# Patient Record
Sex: Female | Born: 1990 | Race: Black or African American | Hispanic: No | Marital: Single | Smoking: Never smoker
Health system: Southern US, Community
[De-identification: ages and names within clinical notes are randomized; demographics above are authoritative.]

## PROBLEM LIST (undated history)

## (undated) HISTORY — PX: ACHILLES TENDON REPAIR: SUR1153

---
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
## 2009-05-01 ENCOUNTER — Emergency Department (HOSPITAL_COMMUNITY): Admission: EM | Admit: 2009-05-01 | Discharge: 2009-05-01 | Payer: Self-pay | Admitting: Family Medicine

## 2009-05-09 ENCOUNTER — Other Ambulatory Visit: Admission: RE | Admit: 2009-05-09 | Discharge: 2009-05-09 | Payer: Self-pay | Admitting: Family Medicine

## 2010-05-08 LAB — POCT PREGNANCY, URINE: Preg Test, Ur: NEGATIVE

## 2011-03-14 ENCOUNTER — Ambulatory Visit (INDEPENDENT_AMBULATORY_CARE_PROVIDER_SITE_OTHER): Payer: Managed Care, Other (non HMO) | Admitting: Internal Medicine

## 2011-03-14 VITALS — BP 104/72 | HR 88 | Temp 98.4°F | Resp 16 | Ht 62.75 in | Wt 159.0 lb

## 2011-03-14 DIAGNOSIS — J029 Acute pharyngitis, unspecified: Secondary | ICD-10-CM

## 2011-03-14 MED ORDER — MELOXICAM 7.5 MG PO TABS: 7.5000 mg | ORAL_TABLET | Freq: Every day | ORAL | Status: AC

## 2011-03-14 MED ORDER — HYDROCODONE-HOMATROPINE 5-1.5 MG/5ML PO SYRP: 5.0000 mL | ORAL_SOLUTION | Freq: Three times a day (TID) | ORAL | Status: AC | PRN

## 2011-03-14 NOTE — Patient Instructions (Signed)
See A&P 

## 2011-03-14 NOTE — Progress Notes (Signed)
  Subjective:    Patient ID: Whitney Hall, female    DOB: 11-27-1990, 21 y.o.   MRN: [id number]  Cough This is a new problem. The current episode started yesterday. The problem has been unchanged. The problem occurs constantly. The cough is non-productive. Associated symptoms include headaches and a sore throat. Pertinent negatives include no ear pain or rhinorrhea. The symptoms are aggravated by cold air.  Sore Throat  Associated symptoms include coughing and headaches. Pertinent negatives include no ear pain or neck pain.  Headache  Associated symptoms include coughing and a sore throat. Pertinent negatives include no ear pain, neck pain or rhinorrhea.      Review of Systems  Constitutional: Negative.   HENT: Positive for sore throat. Negative for ear pain, rhinorrhea and neck pain.   Eyes: Negative.   Respiratory: Positive for cough.   Cardiovascular: Negative.   Gastrointestinal: Negative.   Genitourinary: Negative.   Musculoskeletal: Negative.   Neurological: Positive for headaches.       Objective:   Physical Exam  Constitutional: She is oriented to person, place, and time. She appears well-developed and well-nourished.  HENT:  Head: Normocephalic.  Eyes: Conjunctivae are normal. Pupils are equal, round, and reactive to light.  Neck: Neck supple.  Cardiovascular: Normal rate, regular rhythm and normal heart sounds.   Pulmonary/Chest: Effort normal and breath sounds normal. No respiratory distress. She has no wheezes. She has no rales. She exhibits no tenderness.  Abdominal: Soft.  Neurological: She is alert and oriented to person, place, and time.  Skin: Skin is warm and dry.          Assessment & Plan:  [name]'s symptoms are consistent with a viral illness and she is prescribed medications to relieve her symptoms.  She is advised to rest and hydrate well and RTC if not improved in 2-3 days.

## 2013-10-08 ENCOUNTER — Other Ambulatory Visit: Payer: Self-pay | Admitting: Otolaryngology

## 2013-10-08 DIAGNOSIS — E010 Iodine-deficiency related diffuse (endemic) goiter: Secondary | ICD-10-CM

## 2013-10-16 ENCOUNTER — Ambulatory Visit
Admission: RE | Admit: 2013-10-16 | Discharge: 2013-10-16 | Disposition: A | Discharge status: Home / Self Care | Payer: Managed Care, Other (non HMO) | Source: Ambulatory Visit | Attending: Otolaryngology | Admitting: Otolaryngology

## 2013-10-16 DIAGNOSIS — E010 Iodine-deficiency related diffuse (endemic) goiter: Secondary | ICD-10-CM

## 2013-10-16 MED ORDER — IOHEXOL 300 MG/ML  SOLN
75.0000 mL | Freq: Once | INTRAMUSCULAR | Status: AC | PRN
  Administered 2013-10-16: 75 mL via INTRAVENOUS

## 2014-10-26 ENCOUNTER — Other Ambulatory Visit: Payer: Self-pay | Admitting: Otolaryngology

## 2014-10-26 DIAGNOSIS — E010 Iodine-deficiency related diffuse (endemic) goiter: Secondary | ICD-10-CM

## 2015-05-08 ENCOUNTER — Emergency Department (HOSPITAL_BASED_OUTPATIENT_CLINIC_OR_DEPARTMENT_OTHER)
Admission: EM | Admit: 2015-05-08 | Discharge: 2015-05-08 | Disposition: A | Discharge status: Home / Self Care | Payer: No Typology Code available for payment source | Attending: Emergency Medicine | Admitting: Emergency Medicine

## 2015-05-08 ENCOUNTER — Encounter (HOSPITAL_BASED_OUTPATIENT_CLINIC_OR_DEPARTMENT_OTHER): Payer: Self-pay | Admitting: Emergency Medicine

## 2015-05-08 DIAGNOSIS — Y92410 Unspecified street and highway as the place of occurrence of the external cause: Secondary | ICD-10-CM | POA: Insufficient documentation

## 2015-05-08 DIAGNOSIS — R519 Headache, unspecified: Secondary | ICD-10-CM

## 2015-05-08 DIAGNOSIS — Y998 Other external cause status: Secondary | ICD-10-CM | POA: Insufficient documentation

## 2015-05-08 DIAGNOSIS — Z79899 Other long term (current) drug therapy: Secondary | ICD-10-CM | POA: Insufficient documentation

## 2015-05-08 DIAGNOSIS — S0990XA Unspecified injury of head, initial encounter: Secondary | ICD-10-CM | POA: Insufficient documentation

## 2015-05-08 DIAGNOSIS — M542 Cervicalgia: Secondary | ICD-10-CM

## 2015-05-08 DIAGNOSIS — S199XXA Unspecified injury of neck, initial encounter: Secondary | ICD-10-CM | POA: Insufficient documentation

## 2015-05-08 DIAGNOSIS — Y9389 Activity, other specified: Secondary | ICD-10-CM | POA: Insufficient documentation

## 2015-05-08 DIAGNOSIS — R51 Headache: Secondary | ICD-10-CM
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
## 2015-05-08 NOTE — ED Notes (Signed)
Pt reports history of migraines, states her migraines are not daily, pt has had pain to back of head that is radiating to top of head since thur, no relief
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
## 2015-07-07 ENCOUNTER — Other Ambulatory Visit: Payer: Self-pay | Admitting: Family Medicine

## 2015-07-07 DIAGNOSIS — R5381 Other malaise: Secondary | ICD-10-CM

## 2015-07-13 ENCOUNTER — Other Ambulatory Visit: Payer: Self-pay | Admitting: Family Medicine

## 2015-07-13 DIAGNOSIS — N644 Mastodynia: Secondary | ICD-10-CM

## 2015-07-13 DIAGNOSIS — N6011 Diffuse cystic mastopathy of right breast: Secondary | ICD-10-CM

## 2015-07-19 ENCOUNTER — Other Ambulatory Visit: Payer: Managed Care, Other (non HMO)

## 2015-07-26 ENCOUNTER — Ambulatory Visit
Admission: RE | Admit: 2015-07-26 | Discharge: 2015-07-26 | Disposition: A | Discharge status: Home / Self Care | Payer: BLUE CROSS/BLUE SHIELD | Source: Ambulatory Visit | Attending: Family Medicine | Admitting: Family Medicine

## 2015-07-26 DIAGNOSIS — N644 Mastodynia: Secondary | ICD-10-CM

## 2015-07-26 DIAGNOSIS — N6011 Diffuse cystic mastopathy of right breast: Secondary | ICD-10-CM

## 2016-12-29 ENCOUNTER — Emergency Department (HOSPITAL_COMMUNITY)
Admission: EM | Admit: 2016-12-29 | Discharge: 2016-12-30 | Disposition: A | Discharge status: Home / Self Care | Payer: Self-pay | Attending: Emergency Medicine | Admitting: Emergency Medicine

## 2016-12-29 ENCOUNTER — Encounter (HOSPITAL_COMMUNITY): Payer: Self-pay | Admitting: Emergency Medicine

## 2016-12-29 ENCOUNTER — Other Ambulatory Visit: Payer: Self-pay

## 2016-12-29 ENCOUNTER — Emergency Department (HOSPITAL_COMMUNITY): Payer: Self-pay

## 2016-12-29 DIAGNOSIS — Z79899 Other long term (current) drug therapy: Secondary | ICD-10-CM | POA: Insufficient documentation

## 2016-12-29 DIAGNOSIS — R05 Cough: Secondary | ICD-10-CM

## 2016-12-29 DIAGNOSIS — R059 Cough, unspecified: Secondary | ICD-10-CM

## 2016-12-29 DIAGNOSIS — B349 Viral infection, unspecified: Secondary | ICD-10-CM | POA: Insufficient documentation

## 2016-12-29 MED ORDER — NAPROXEN 500 MG PO TABS
500.0000 mg | ORAL_TABLET | Freq: Once | ORAL | Status: AC
  Administered 2016-12-29: 500 mg via ORAL
  Filled 2016-12-29: qty 1

## 2016-12-29 NOTE — ED Triage Notes (Signed)
Patient is complaining of cold chills, coughing, sore throat, generalized body aches, nausea, and headache. Patient states this started yesterday.

## 2016-12-30 MED ORDER — NAPROXEN 500 MG PO TABS: 500.0000 mg | ORAL_TABLET | Freq: Two times a day (BID) | ORAL | 0 refills | Status: DC | PRN

## 2016-12-30 NOTE — Discharge Instructions (Signed)
It was my pleasure taking care of you today!  Naproxen as needed for body aches / fevers. You can alternate with Tylenol as needed. Flonase and mucinex for nasal congestion. Rest, drink plenty of fluids to stay hydrated. Wash your hands often.  Follow up with your doctor in regards to your hospital visit. Return to the emergency department if symptoms worsen, become progressive, or become more concerning.

## 2016-12-30 NOTE — ED Provider Notes (Signed)
[facility] Provider Note   CSN: [id number] Arrival date & time: 12/29/16  2120     History   Chief Complaint Chief Complaint  Patient presents with  . Generalized Body Aches    HPI Whitney Hall is a 26 y.o. female.  The history is provided by the patient and medical records. No language interpreter was used.   Whitney MoodyKyrra A Fish is an otherwise healthy 26 y.o. female who presents to the Emergency Department complaining of generalized body aches, cough, congestion, sore throat and headache which began acutely yesterday. Denies objective fever, but does endorse going back and forth between chills and sweats. She has tried OTC cold medication with little improvement. 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 Has not had flu vaccine. Denies abdominal pain, vomiting, diarrhea, chest pain, shortness of breath.   History reviewed. No pertinent past medical history.  There are no active problems to display for this patient.   History reviewed. No pertinent surgical history.  OB History    No data available       Home Medications    Prior to Admission medications   Medication Sig Start Date End Date Taking? Authorizing Provider  naproxen (NAPROSYN) 500 MG tablet Take 1 tablet (500 mg total) 2 (two) times daily as needed by mouth for mild pain or moderate pain. 12/30/16   Ward, Chase PicketJaime Pilcher, PA-C  topiramate (TOPAMAX) 25 MG tablet Take 100 mg by mouth 3 (three) times daily.     [provider]    Family History History reviewed. No pertinent family history.  Social History Social History   Tobacco Use  . Smoking status: Never Smoker  . Smokeless tobacco: Never Used  Substance Use Topics  . Alcohol use: No  . Drug use: Not on file     Allergies   Patient has no known allergies.   Review of Systems Review of Systems  Constitutional: Positive for chills. Negative for fever.  HENT: Positive for  congestion and sore throat.   Respiratory: Positive for cough. Negative for shortness of breath.   Cardiovascular: Negative for chest pain.  Gastrointestinal: Negative for abdominal pain, constipation, diarrhea, nausea and vomiting.  Musculoskeletal: Positive for myalgias. Negative for back pain and neck pain.  Skin: Negative for rash.  Neurological: Positive for headaches. Negative for dizziness, syncope and weakness.  All other systems reviewed and are negative.    Physical Exam Updated Vital Signs BP 115/77   Pulse 72   Temp 98.3 F (36.8 C) (Oral)   Resp 18   Ht 5\' 4"  (1.626 m)   Wt 79.4 kg (175 lb)   SpO2 100%   BMI 30.04 kg/m   Physical Exam  Constitutional: She is oriented to person, place, and time. She appears well-developed and well-nourished. No distress.  HENT:  Head: Normocephalic and atraumatic.  OP with erythema, no exudates or tonsillar hypertrophy. + nasal congestion with mucosal edema.   Neck: Normal range of motion. Neck supple.  No meningeal signs.   Cardiovascular: Normal rate, regular rhythm and normal heart sounds.  Pulmonary/Chest: Effort normal.  Lungs are clear to auscultation bilaterally - no w/r/r  Abdominal: Soft. She exhibits no distension. There is no tenderness.  Musculoskeletal: Normal range of motion.  Neurological: She is alert and oriented to person, place, and time.  Skin: Skin is warm and dry. She is not diaphoretic.  Nursing note and vitals reviewed.    ED Treatments / Results  Labs (  all labs ordered are listed, but only abnormal results are displayed) Labs Reviewed - No data to display  EKG  EKG Interpretation None       Radiology Dg Chest 2 View  Result Date: 12/29/2016 CLINICAL DATA:  26 year old female with cough and shortness of breath. EXAM: CHEST  2 VIEW COMPARISON:  None. FINDINGS: The heart size and mediastinal contours are within normal limits. Both lungs are clear. The visualized skeletal structures are  unremarkable. IMPRESSION: No active cardiopulmonary disease. Electronically Signed   By: Elgie CollardArash  Radparvar M.D.   On: 12/29/2016 23:35    Procedures Procedures (including critical care time)  Medications Ordered in ED Medications  naproxen (NAPROSYN) tablet 500 mg (500 mg Oral Given 12/29/16 2340)     Initial Impression / Assessment and Plan / ED Course  I have reviewed the triage vital signs and the nursing notes.  Pertinent labs & imaging results that were available during my care of the patient were reviewed by me and considered in my medical decision making (see chart for details).    Whitney MoodyKyrra A Salem is a 26 y.o. female who presents to ED for cough, congestion, sore throat, myalgias, chills which began last night.   On exam, patient is afebrile, non-toxic appearing with a clear lung exam. Mild rhinorrhea and OP with erythema but no exudates or tonsillar hypertrophy.  CXR negative.  Sxs today likely due to viral URI.Symptomatic home care instructions discussed. Rx for naproxen given. PCP follow up strongly encouraged if symptoms persist. Reasons to return to ER discussed. All questions answered.   Blood pressure 115/77, pulse 72, temperature 98.3 F (36.8 C), temperature source Oral, resp. rate 18, height 5\' 4"  (1.626 m), weight 79.4 kg (175 lb), SpO2 100 %.   Final Clinical Impressions(s) / ED Diagnoses   Final diagnoses:  Cough  Viral syndrome    ED Discharge Orders        Ordered    naproxen (NAPROSYN) 500 MG tablet  2 times daily PRN     12/30/16 0004       Ward, Chase PicketJaime Pilcher, PA-C 12/30/16 0030    Little, Ambrose Finlandachel Morgan, MD 12/30/16 [phone]

## 2019-09-16 ENCOUNTER — Emergency Department (HOSPITAL_COMMUNITY): Payer: No Typology Code available for payment source

## 2019-09-16 ENCOUNTER — Other Ambulatory Visit: Payer: Self-pay

## 2019-09-16 ENCOUNTER — Emergency Department (HOSPITAL_COMMUNITY)
Admission: EM | Admit: 2019-09-16 | Discharge: 2019-09-17 | Disposition: A | Discharge status: Home / Self Care | Payer: No Typology Code available for payment source | Attending: Emergency Medicine | Admitting: Emergency Medicine

## 2019-09-16 DIAGNOSIS — M549 Dorsalgia, unspecified: Secondary | ICD-10-CM | POA: Insufficient documentation

## 2019-09-16 DIAGNOSIS — Y939 Activity, unspecified: Secondary | ICD-10-CM | POA: Insufficient documentation

## 2019-09-16 DIAGNOSIS — Y92523 Highway rest stop as the place of occurrence of the external cause: Secondary | ICD-10-CM | POA: Diagnosis not present

## 2019-09-16 DIAGNOSIS — M25512 Pain in left shoulder: Secondary | ICD-10-CM | POA: Diagnosis not present

## 2019-09-16 DIAGNOSIS — R519 Headache, unspecified: Secondary | ICD-10-CM | POA: Insufficient documentation

## 2019-09-16 DIAGNOSIS — Y999 Unspecified external cause status: Secondary | ICD-10-CM | POA: Insufficient documentation

## 2019-09-16 DIAGNOSIS — M25562 Pain in left knee: Secondary | ICD-10-CM | POA: Diagnosis not present

## 2019-09-16 DIAGNOSIS — Z5321 Procedure and treatment not carried out due to patient leaving prior to being seen by health care provider: Secondary | ICD-10-CM | POA: Diagnosis not present

## 2019-09-16 DIAGNOSIS — M542 Cervicalgia: Secondary | ICD-10-CM | POA: Diagnosis not present
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
## 2019-09-17 ENCOUNTER — Encounter (HOSPITAL_BASED_OUTPATIENT_CLINIC_OR_DEPARTMENT_OTHER): Payer: Self-pay | Admitting: *Deleted

## 2019-09-17 ENCOUNTER — Emergency Department (HOSPITAL_BASED_OUTPATIENT_CLINIC_OR_DEPARTMENT_OTHER)
Admission: EM | Admit: 2019-09-17 | Discharge: 2019-09-17 | Disposition: A | Discharge status: Home / Self Care | Payer: No Typology Code available for payment source | Source: Home / Self Care | Attending: Emergency Medicine | Admitting: Emergency Medicine

## 2019-09-17 ENCOUNTER — Other Ambulatory Visit: Payer: Self-pay

## 2019-09-17 DIAGNOSIS — S46812A Strain of other muscles, fascia and tendons at shoulder and upper arm level, left arm, initial encounter: Secondary | ICD-10-CM

## 2019-09-17 DIAGNOSIS — Y939 Activity, unspecified: Secondary | ICD-10-CM | POA: Insufficient documentation

## 2019-09-17 DIAGNOSIS — Y92410 Unspecified street and highway as the place of occurrence of the external cause: Secondary | ICD-10-CM | POA: Insufficient documentation

## 2019-09-17 DIAGNOSIS — S060X0A Concussion without loss of consciousness, initial encounter: Secondary | ICD-10-CM

## 2019-09-17 DIAGNOSIS — M791 Myalgia, unspecified site: Secondary | ICD-10-CM | POA: Insufficient documentation

## 2019-09-17 DIAGNOSIS — S8392XA Sprain of unspecified site of left knee, initial encounter: Secondary | ICD-10-CM

## 2019-09-17 DIAGNOSIS — Y999 Unspecified external cause status: Secondary | ICD-10-CM | POA: Insufficient documentation

## 2019-09-17 DIAGNOSIS — S83402A Sprain of unspecified collateral ligament of left knee, initial encounter: Secondary | ICD-10-CM | POA: Insufficient documentation

## 2019-09-17 MED ORDER — METHOCARBAMOL 500 MG PO TABS: 500.0000 mg | ORAL_TABLET | Freq: Three times a day (TID) | ORAL | 0 refills | Status: DC | PRN

## 2019-09-17 MED FILL — METHOCARBAMOL 500 MG TABS: 500 | 3 days supply | Qty: 10 | Fill #0
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
## 2019-09-17 NOTE — ED Notes (Signed)
Pt did not respond when called for vitals recheck 
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
## 2019-09-23 ENCOUNTER — Ambulatory Visit: Payer: Self-pay | Admitting: Family Medicine

## 2019-09-28 ENCOUNTER — Ambulatory Visit: Payer: Self-pay | Admitting: Family Medicine

## 2019-09-28 NOTE — Progress Notes (Deleted)
  Whitney Hall - 29 y.o. female MRN [id number]  Date of birth: August 01, 1990  SUBJECTIVE:  Including CC & ROS.  No chief complaint on file.   Whitney Hall is a 29 y.o. female that is  ***.  ***   Review of Systems See HPI   HISTORY: Past Medical, Surgical, Social, and Family History Reviewed & Updated per EMR.   Pertinent Historical Findings include:  No past medical history on file.  Past Surgical History:  Procedure Laterality Date  . ACHILLES TENDON REPAIR      No family history on file.  Social History   Socioeconomic History  . Marital status: Single    Spouse name: Not on file  . Number of children: Not on file  . Years of education: Not on file  . Highest education level: Not on file  Occupational History  . Not on file  Tobacco Use  . Smoking status: Never Smoker  . Smokeless tobacco: Never Used  Vaping Use  . Vaping Use: Never used  Substance and Sexual Activity  . Alcohol use: No  . Drug use: Never  . Sexual activity: Yes    Comment: same sex partner  Other Topics Concern  . Not on file  Social History Narrative  . Not on file   Social Determinants of Health   Financial Resource Strain:   . Difficulty of Paying Living Expenses:   Food Insecurity:   . Worried About Programme researcher, broadcasting/film/video in the Last Year:   . Barista in the Last Year:   Transportation Needs:   . Freight forwarder (Medical):   Marland Kitchen Lack of Transportation (Non-Medical):   Physical Activity:   . Days of Exercise per Week:   . Minutes of Exercise per Session:   Stress:   . Feeling of Stress :   Social Connections:   . Frequency of Communication with Friends and Family:   . Frequency of Social Gatherings with Friends and Family:   . Attends Religious Services:   . Active Member of Clubs or Organizations:   . Attends Banker Meetings:   Marland Kitchen Marital Status:   Intimate Partner Violence:   . Fear of Current or Ex-Partner:   . Emotionally Abused:   Marland Kitchen Physically  Abused:   . Sexually Abused:      PHYSICAL EXAM:  VS: There were no vitals taken for this visit. Physical Exam Gen: NAD, alert, cooperative with exam, well-appearing MSK:  ***      ASSESSMENT & PLAN:   No problem-specific Assessment & Plan notes found for this encounter.

## 2021-06-30 IMAGING — CR DG KNEE COMPLETE 4+V*L*
4 series · 4 of 4 positions shown · non-contrast
Comparison: None.

CLINICAL DATA: Left knee pain, history of prior left knee surgery

EXAM:
LEFT KNEE - COMPLETE 4+ VIEW

[knee ap]
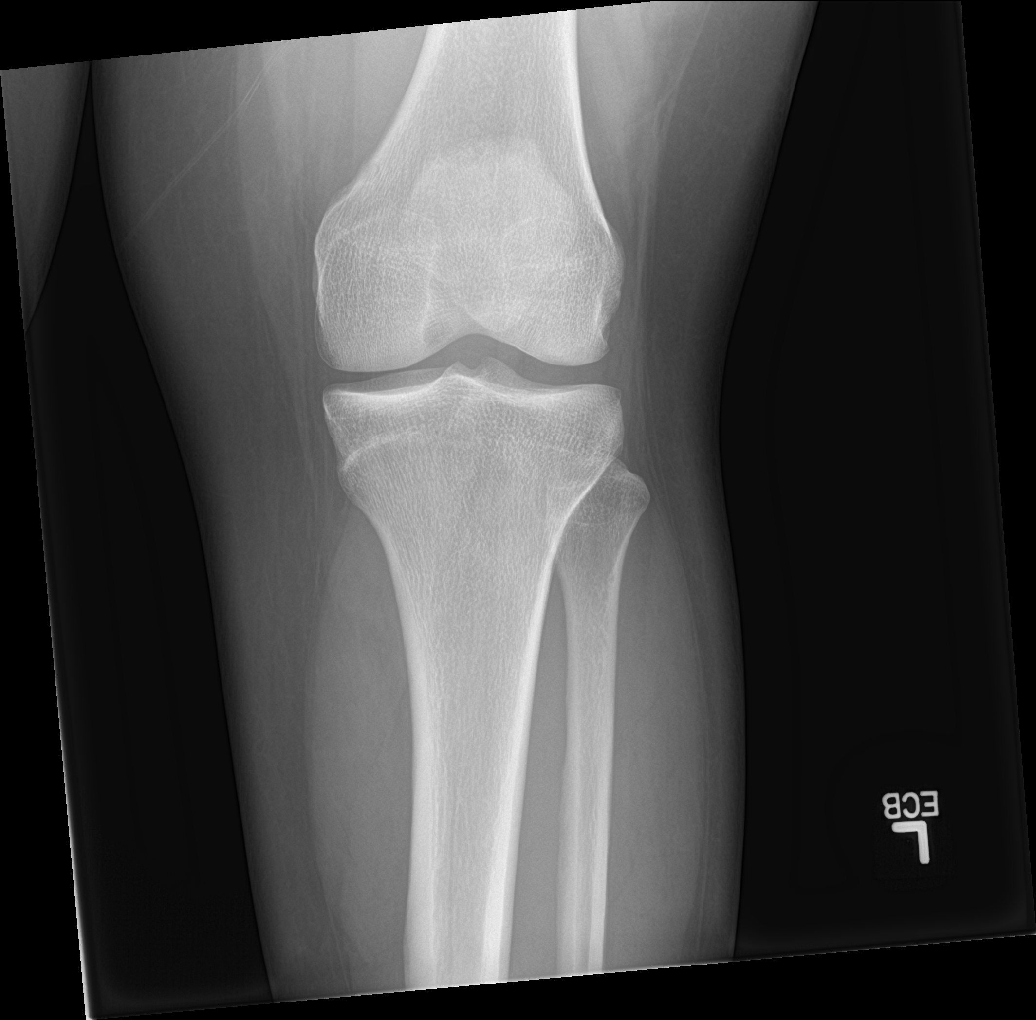

[knee lat]
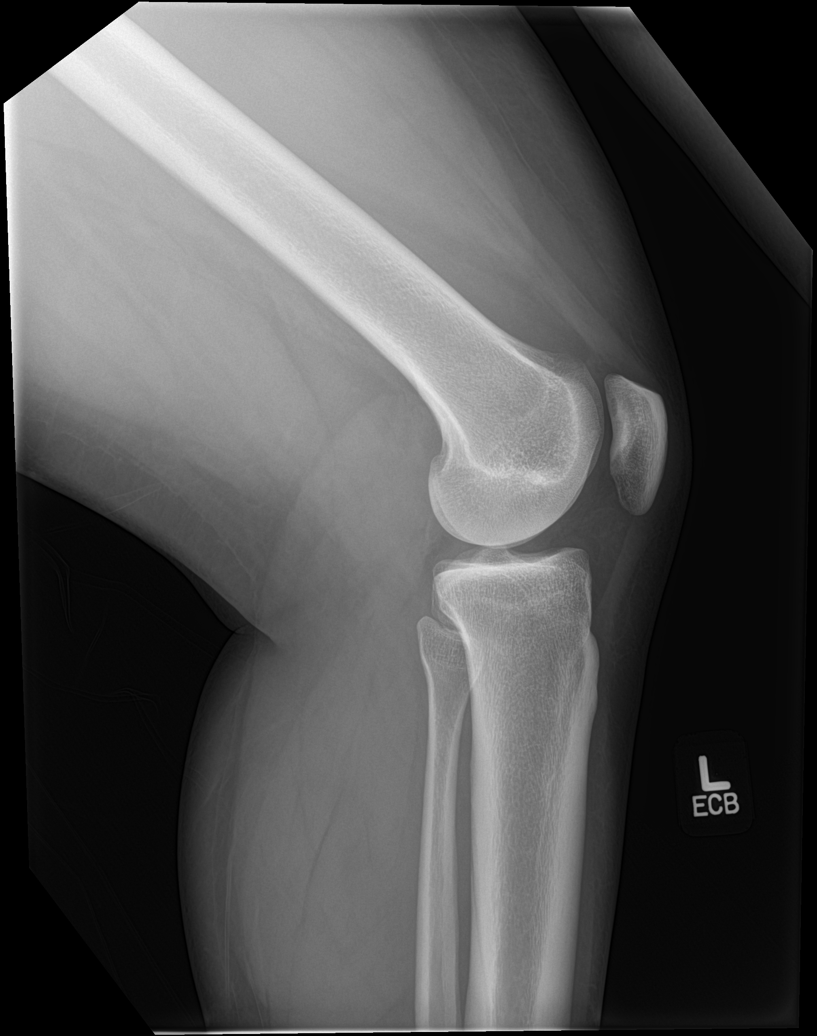

[knee obl (1 of 2)]
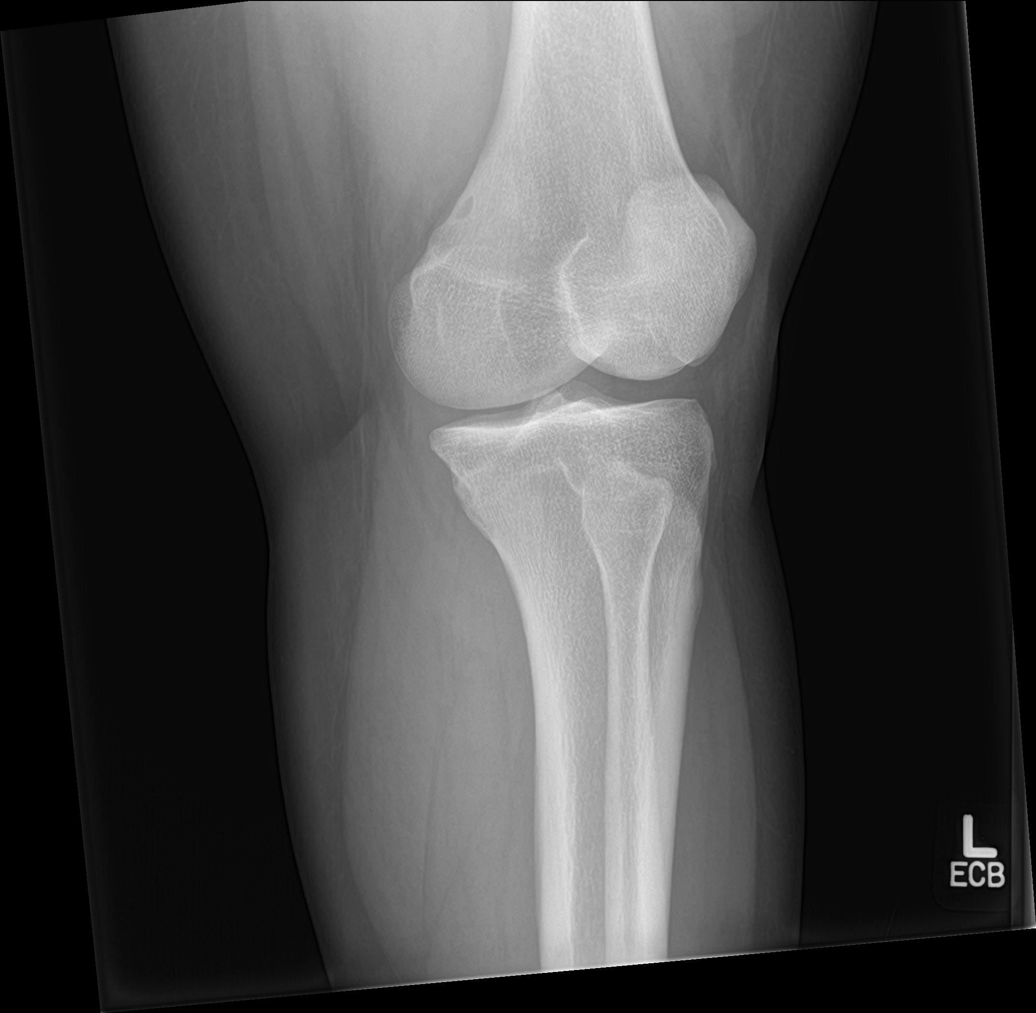

[knee obl (2 of 2)]
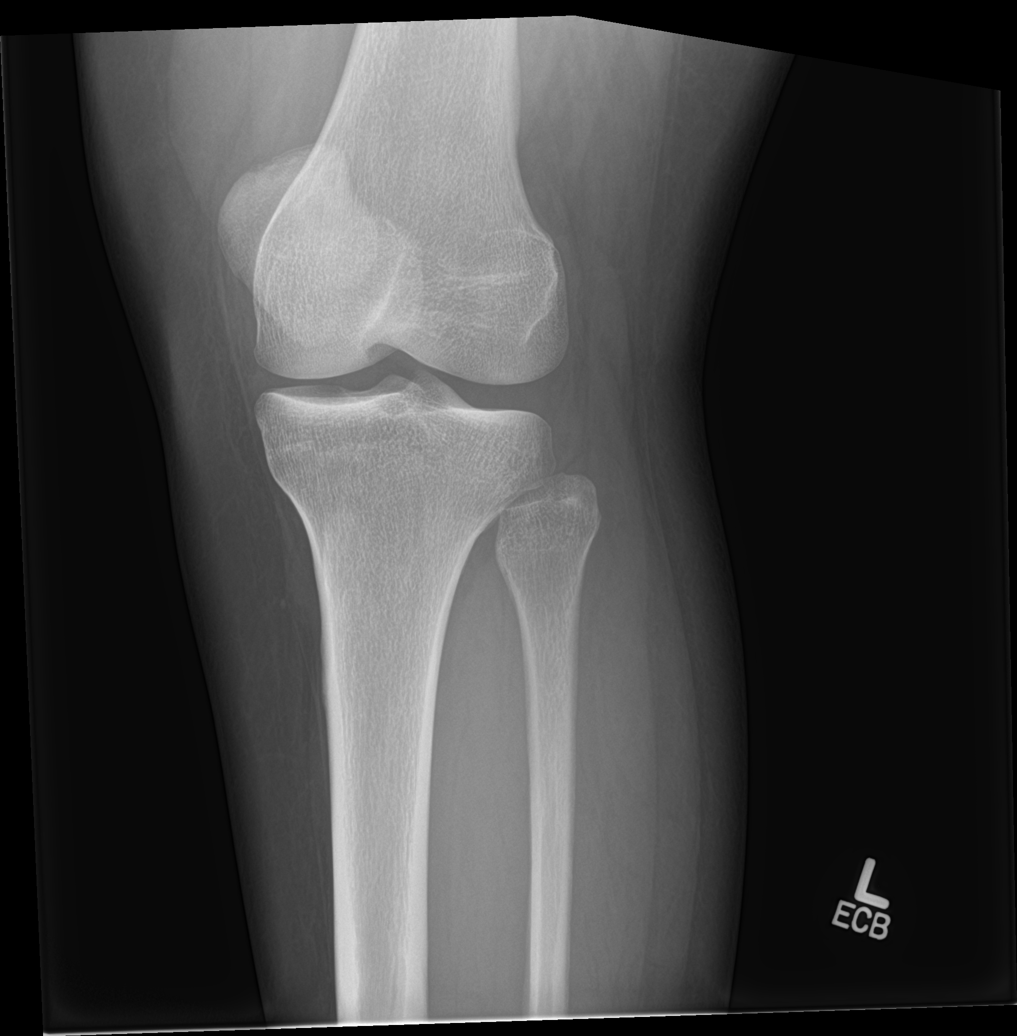

[4 of 4 positions shown; findings below may reference images not displayed]

FINDINGS: Sclerosis and rounded lucency along the posterior facet of the
patella, could reflect an osteochondral injury. No other acute
osseous abnormality. No sizeable joint effusion or significant soft
tissue swelling. No evidence of arthropathy or other focal bone
abnormality.
IMPRESSION: Sclerosis and rounded lucency along the posterior facet of the
patella, could reflect an osteochondral injury although no
significant swelling or effusion is present.

No other acute osseous abnormality.
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
## 2022-05-09 ENCOUNTER — Emergency Department (HOSPITAL_BASED_OUTPATIENT_CLINIC_OR_DEPARTMENT_OTHER)
Admission: EM | Admit: 2022-05-09 | Discharge: 2022-05-09 | Disposition: A | Discharge status: Home / Self Care | Payer: Medicaid Other | Attending: Emergency Medicine | Admitting: Emergency Medicine

## 2022-05-09 ENCOUNTER — Encounter (HOSPITAL_BASED_OUTPATIENT_CLINIC_OR_DEPARTMENT_OTHER): Payer: Self-pay | Admitting: Emergency Medicine

## 2022-05-09 ENCOUNTER — Other Ambulatory Visit: Payer: Self-pay

## 2022-05-09 DIAGNOSIS — H02841 Edema of right upper eyelid: Secondary | ICD-10-CM | POA: Diagnosis present

## 2022-05-09 DIAGNOSIS — H00011 Hordeolum externum right upper eyelid: Secondary | ICD-10-CM | POA: Insufficient documentation

## 2022-05-09 NOTE — ED Triage Notes (Signed)
Pt woke with swelling to RT side face; sts eye is painful

## 2022-05-09 NOTE — ED Provider Notes (Signed)
[place] [facility] Provider Note   CSN: [place]:[id number] Arrival date & time: 05/09/22  1239     History Chief Complaint  Patient presents with   Facial Swelling    Whitney Hall is a 32 y.o. female.  Right eyelid pain.  She reports that this pain began on Friday of last week and went away after several days but came back earlier today.  She reports that she is concerned that there has been some swelling to the right side of her face but no obvious redness has been noted.  Patient denies any drainage or irritation of the eye itself.  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  Has not tried taking any medications to help with the pain at this time.  HPI     Home Medications Prior to Admission medications   Medication Sig Start Date End Date Taking? Authorizing Provider  methocarbamol (ROBAXIN) 500 MG tablet Take 1 tablet (500 mg total) by mouth every 8 (eight) hours as needed for muscle spasms. 09/17/19   Sherwood Gambler, MD  naproxen (NAPROSYN) 500 MG tablet Take 1 tablet (500 mg total) 2 (two) times daily as needed by mouth for mild pain or moderate pain. 12/30/16   Ward, Ozella Almond, PA-C  topiramate (TOPAMAX) 25 MG tablet Take 100 mg by mouth 3 (three) times daily.     [provider]      Allergies    Nitrous oxide    Review of Systems   Review of Systems  Eyes:  Positive for pain.  All other systems reviewed and are negative.   Physical Exam Updated Vital Signs BP 117/84 (BP Location: Right Arm)   Pulse 69   Temp 98.5 F (36.9 C) (Oral)   Resp 16   Ht 5\' 3"  (1.6 m)   Wt 93 kg   LMP 05/08/2022   SpO2 100%   BMI 36.31 kg/m  Physical Exam Vitals and nursing note reviewed.  Constitutional:      General: She is not in acute distress.    Appearance: She is well-developed.  HENT:     Head: Normocephalic and atraumatic.  Eyes:     General:        Right eye: No discharge.        Left eye: No discharge.      Conjunctiva/sclera: Conjunctivae normal.      Comments: External stye of the right eye noted.  Not actively draining.  Cardiovascular:     Rate and Rhythm: Normal rate and regular rhythm.     Heart sounds: No murmur heard. Pulmonary:     Effort: Pulmonary effort is normal. No respiratory distress.     Breath sounds: Normal breath sounds.  Abdominal:     Palpations: Abdomen is soft.     Tenderness: There is no abdominal tenderness.  Musculoskeletal:        General: No swelling.     Cervical back: Neck supple.  Skin:    General: Skin is warm and dry.     Capillary Refill: Capillary refill takes less than 2 seconds.  Neurological:     Mental Status: She is alert.  Psychiatric:        Mood and Affect: Mood normal.     ED Results / Procedures / Treatments   Labs (all labs ordered are listed, but only abnormal results are displayed) Labs Reviewed - No data to display  EKG None  Radiology No results  found.  Procedures Procedures   Medications Ordered in ED Medications - No data to display  ED Course/ Medical Decision Making/ A&P                           Medical Decision Making  This patient presents to the ED for concern of right-sided facial swelling.  Differential diagnosis includes orbital cellulitis, preseptal cellulitis, bacterial conjunctivitis, stye, chalazion   Problem List / ED Course:  Patient presented to the ED for concerns of right sided eye pain and facial swelling. She reports symptoms began on Friday but generally improved on their own until returning today. Denies pain with eye movement or vision changes. 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 On exam, there appears to be a small white pustule that is not draining on the upper eyelid of the right eye consistent with a stye. Given lack of other symptoms and minimal facial swelling noted, advised patient that stye management is typically outpatient with use of warm  compresses and OTC analgesics such as Tylenol, Ibuprofen, or Aleve. I have very low suspicion for orbital or preseptal cellulitis at this time. Patient agreeable with this treatment plan and verbalized understanding all return precautions. Advised patient to return if her facial swelling worsens or she begins to experience blurry vision. All questions answered prior to patient discharge.  Final Clinical Impression(s) / ED Diagnoses Final diagnoses:  Hordeolum externum of right upper eyelid    Rx / DC Orders ED Discharge Orders     None         Luvenia Heller, PA-C 05/09/22 1315    Fredia Sorrow, MD 05/10/22 [phone]

## 2022-05-09 NOTE — Discharge Instructions (Signed)
You are seen in the emergency department for right eye pain and right facial swelling.  Thankfully on my examination it appears that you have a stye that is starting to develop on the right eyelid.  You should use warm compresses to help alleviate some of the pressure in the area and hopefully eventually have some drainage from this area to reduce the pain there.  You should avoid wearing any eye make-up for the next few days as this begins to recover.  There is no need for any antibiotics topically or oral at this time.  Please follow-up closely with your primary care provider if symptoms are worsening as of the be concerning for potential cellulitis that could be spreading in the area that would require further intervention.

## 2022-05-29 ENCOUNTER — Encounter: Payer: Self-pay | Admitting: *Deleted

## 2023-09-01 ENCOUNTER — Emergency Department (HOSPITAL_COMMUNITY)
Admission: EM | Admit: 2023-09-01 | Discharge: 2023-09-02 | Disposition: A | Discharge status: Home / Self Care | Attending: Emergency Medicine | Admitting: Emergency Medicine

## 2023-09-01 ENCOUNTER — Other Ambulatory Visit: Payer: Self-pay

## 2023-09-01 ENCOUNTER — Emergency Department (HOSPITAL_COMMUNITY)

## 2023-09-01 ENCOUNTER — Encounter (HOSPITAL_COMMUNITY): Payer: Self-pay | Admitting: Emergency Medicine

## 2023-09-01 DIAGNOSIS — S8012XA Contusion of left lower leg, initial encounter: Secondary | ICD-10-CM | POA: Diagnosis not present

## 2023-09-01 DIAGNOSIS — R102 Pelvic and perineal pain: Secondary | ICD-10-CM | POA: Diagnosis not present

## 2023-09-01 DIAGNOSIS — S70312A Abrasion, left thigh, initial encounter: Secondary | ICD-10-CM | POA: Diagnosis not present

## 2023-09-01 DIAGNOSIS — S60512A Abrasion of left hand, initial encounter: Secondary | ICD-10-CM | POA: Diagnosis not present

## 2023-09-01 DIAGNOSIS — S60511A Abrasion of right hand, initial encounter: Secondary | ICD-10-CM | POA: Diagnosis not present

## 2023-09-01 DIAGNOSIS — S40212A Abrasion of left shoulder, initial encounter: Secondary | ICD-10-CM | POA: Diagnosis not present

## 2023-09-01 DIAGNOSIS — I10 Essential (primary) hypertension: Secondary | ICD-10-CM | POA: Diagnosis not present

## 2023-09-01 DIAGNOSIS — T07XXXA Unspecified multiple injuries, initial encounter: Secondary | ICD-10-CM

## 2023-09-01 DIAGNOSIS — M25519 Pain in unspecified shoulder: Secondary | ICD-10-CM | POA: Diagnosis not present

## 2023-09-01 DIAGNOSIS — M79662 Pain in left lower leg: Secondary | ICD-10-CM | POA: Diagnosis not present

## 2023-09-01 DIAGNOSIS — M79652 Pain in left thigh: Secondary | ICD-10-CM | POA: Diagnosis not present

## 2023-09-01 DIAGNOSIS — M79641 Pain in right hand: Secondary | ICD-10-CM | POA: Diagnosis not present

## 2023-09-01 DIAGNOSIS — M25512 Pain in left shoulder: Secondary | ICD-10-CM | POA: Diagnosis not present

## 2023-09-01 DIAGNOSIS — Y92410 Unspecified street and highway as the place of occurrence of the external cause: Secondary | ICD-10-CM | POA: Insufficient documentation

## 2023-09-01 DIAGNOSIS — S8992XA Unspecified injury of left lower leg, initial encounter: Secondary | ICD-10-CM | POA: Diagnosis present

## 2023-09-01 LAB — PREGNANCY, URINE: Preg Test, Ur: NEGATIVE

## 2023-09-01 MED ORDER — ACETAMINOPHEN 500 MG PO TABS
1000.0000 mg | ORAL_TABLET | Freq: Once | ORAL | Status: AC
  Administered 2023-09-01: 1000 mg via ORAL
  Filled 2023-09-01: qty 2
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
## 2023-09-02 MED ORDER — METHOCARBAMOL 500 MG PO TABS: 500.0000 mg | ORAL_TABLET | Freq: Two times a day (BID) | ORAL | 0 refills | Status: AC

## 2023-09-02 MED ORDER — NAPROXEN 500 MG PO TABS: 500.0000 mg | ORAL_TABLET | Freq: Two times a day (BID) | ORAL | 0 refills | Status: AC

## 2023-09-02 NOTE — Discharge Instructions (Signed)
 X-rays today were all normal.  You will likely be very sore for the next few days we discussed. Take the prescribed medication as directed.  Heat/ice to affected areas can help as well. Follow-up with your primary care doctor. Return to the ED for new or worsening symptoms.

## 2023-09-02 NOTE — ED Notes (Signed)
 Pt discharged. Pt given discharge papers and papers explained. Pt in [place] at this time

## 2023-09-17 ENCOUNTER — Other Ambulatory Visit: Payer: Self-pay | Admitting: Sports Medicine

## 2023-09-17 DIAGNOSIS — M67873 Other specified disorders of tendon, right ankle and foot: Secondary | ICD-10-CM

## 2023-09-17 DIAGNOSIS — M25571 Pain in right ankle and joints of right foot: Secondary | ICD-10-CM

## 2023-10-02 ENCOUNTER — Ambulatory Visit
Admission: RE | Admit: 2023-10-02 | Discharge: 2023-10-02 | Disposition: A | Discharge status: Home / Self Care | Source: Ambulatory Visit | Attending: Sports Medicine | Admitting: Sports Medicine

## 2023-10-02 ENCOUNTER — Other Ambulatory Visit: Payer: Self-pay | Admitting: Sports Medicine

## 2023-10-02 DIAGNOSIS — M67873 Other specified disorders of tendon, right ankle and foot: Secondary | ICD-10-CM

## 2023-10-02 DIAGNOSIS — M25571 Pain in right ankle and joints of right foot: Secondary | ICD-10-CM

## 2023-10-12 DIAGNOSIS — Z5329 Procedure and treatment not carried out because of patient's decision for other reasons: Secondary | ICD-10-CM | POA: Diagnosis not present

## 2023-10-12 DIAGNOSIS — R21 Rash and other nonspecific skin eruption: Secondary | ICD-10-CM | POA: Diagnosis not present

## 2023-10-14 DIAGNOSIS — Z79899 Other long term (current) drug therapy: Secondary | ICD-10-CM | POA: Diagnosis not present

## 2023-10-14 DIAGNOSIS — B029 Zoster without complications: Secondary | ICD-10-CM | POA: Diagnosis not present

## 2023-10-14 DIAGNOSIS — Z87891 Personal history of nicotine dependence: Secondary | ICD-10-CM | POA: Diagnosis not present

## 2023-10-14 DIAGNOSIS — Z888 Allergy status to other drugs, medicaments and biological substances status: Secondary | ICD-10-CM | POA: Diagnosis not present

## 2023-10-14 DIAGNOSIS — R21 Rash and other nonspecific skin eruption: Secondary | ICD-10-CM | POA: Diagnosis not present

## 2023-10-14 DIAGNOSIS — J45909 Unspecified asthma, uncomplicated: Secondary | ICD-10-CM | POA: Diagnosis not present
